# Patient Record
Sex: Male | Born: 1995 | Race: Black or African American | Hispanic: No | Marital: Single | Smoking: Never smoker
Health system: Southern US, Community
[De-identification: ages and names within clinical notes are randomized; demographics above are authoritative.]

## PROBLEM LIST (undated history)

## (undated) DIAGNOSIS — J45909 Unspecified asthma, uncomplicated: Secondary | ICD-10-CM

## (undated) HISTORY — PX: ANKLE SURGERY: SHX546

---
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
## 2019-03-01 ENCOUNTER — Encounter (HOSPITAL_COMMUNITY): Payer: Self-pay | Admitting: Emergency Medicine

## 2019-03-01 ENCOUNTER — Emergency Department (HOSPITAL_COMMUNITY)
Admission: EM | Admit: 2019-03-01 | Discharge: 2019-03-01 | Disposition: A | Discharge status: Home / Self Care | Payer: Self-pay | Attending: Emergency Medicine | Admitting: Emergency Medicine

## 2019-03-01 ENCOUNTER — Other Ambulatory Visit: Payer: Self-pay

## 2019-03-01 DIAGNOSIS — Z202 Contact with and (suspected) exposure to infections with a predominantly sexual mode of transmission: Secondary | ICD-10-CM | POA: Insufficient documentation

## 2019-03-01 DIAGNOSIS — J45909 Unspecified asthma, uncomplicated: Secondary | ICD-10-CM | POA: Insufficient documentation

## 2019-03-01 LAB — URINALYSIS, ROUTINE W REFLEX MICROSCOPIC
Bacteria, UA: NONE SEEN
Bilirubin Urine: NEGATIVE
Glucose, UA: NEGATIVE mg/dL
Hgb urine dipstick: NEGATIVE
Ketones, ur: 5 mg/dL — AB
Leukocytes,Ua: NEGATIVE
Nitrite: NEGATIVE
Protein, ur: NEGATIVE mg/dL
Specific Gravity, Urine: 1.024 (ref 1.005–1.030)
pH: 5 (ref 5.0–8.0)

## 2019-03-01 MED ORDER — CEFTRIAXONE SODIUM 250 MG IJ SOLR
250.0000 mg | Freq: Once | INTRAMUSCULAR | Status: AC
  Administered 2019-03-01: 23:00:00 250 mg via INTRAMUSCULAR
  Filled 2019-03-01: qty 250

## 2019-03-01 MED ORDER — AZITHROMYCIN 250 MG PO TABS
1000.0000 mg | ORAL_TABLET | Freq: Once | ORAL | Status: AC
  Administered 2019-03-01: 23:00:00 1000 mg via ORAL
  Filled 2019-03-01: qty 4

## 2019-03-01 MED ORDER — STERILE WATER FOR INJECTION IJ SOLN
INTRAMUSCULAR | Status: AC
  Administered 2019-03-01: 0.9 mL
  Filled 2019-03-01: qty 10

## 2019-03-01 NOTE — ED Triage Notes (Signed)
Patient wants to get tested for STD

## 2019-03-01 NOTE — ED Provider Notes (Signed)
[facility]-EMERGENCY DEPT Provider Note   CSN: [id number] Arrival date & time: 03/01/19  2146    History   Chief Complaint Chief Complaint  Patient presents with  . Exposure to STD    HPI Roger Conner is a 23 y.o. male.     HPI   Pt is a 23 y/o male with a h/o asthma who presents to the ED today for evaluation for exposure to an STD.  States he had unprotected intercourse a few days ago with a male and he is concern for sexually transmitted infection.  Denies any penile discharge, dysuria, frequency, or other symptoms.  States he try to by the health department but it was closed.  He is also requesting HIV/RPR testing.  Past Medical History:  Diagnosis Date  . Asthma     There are no active problems to display for this patient.   Past Surgical History:  Procedure Laterality Date  . ANKLE SURGERY          Home Medications    Prior to Admission medications   Medication Sig Start Date End Date Taking? Authorizing Provider  albuterol (PROVENTIL HFA;VENTOLIN HFA) 108 (90 BASE) MCG/ACT inhaler Inhale 2 puffs into the lungs every 4 (four) hours as needed for wheezing or shortness of breath. 10/17/14   Reuben LikesKeller, David C, MD    Family History History reviewed. No pertinent family history.  Social History Social History   Tobacco Use  . Smoking status: Never Smoker  . Smokeless tobacco: Never Used  Substance Use Topics  . Alcohol use: No  . Drug use: No     Allergies   Patient has no known allergies.   Review of Systems Review of Systems  Constitutional: Negative for fever.  Respiratory: Negative for shortness of breath.   Cardiovascular: Negative for chest pain.  Gastrointestinal: Negative for abdominal pain, constipation, diarrhea, nausea and vomiting.  Genitourinary: Negative for dysuria, frequency, hematuria, penile swelling, scrotal swelling, testicular pain and urgency.  Musculoskeletal: Negative for back pain.   Neurological: Negative for headaches.     Physical Exam Updated Vital Signs BP 135/86 (BP Location: Left Arm)   Pulse 65   Temp 98.8 F (37.1 C) (Oral)   Resp 14   Ht 5\' 7"  (1.702 m)   Wt 72.6 kg   SpO2 100%   BMI 25.06 kg/m   Physical Exam Constitutional:      General: He is not in acute distress.    Appearance: He is well-developed.  Eyes:     Conjunctiva/sclera: Conjunctivae normal.  Cardiovascular:     Rate and Rhythm: Normal rate and regular rhythm.  Pulmonary:     Effort: Pulmonary effort is normal.     Breath sounds: Normal breath sounds.  Abdominal:     Palpations: Abdomen is soft.     Tenderness: There is no abdominal tenderness.  Genitourinary:    Penis: Normal.      Scrotum/Testes: Normal.     Comments: Chaperone present Skin:    General: Skin is warm and dry.  Neurological:     Mental Status: He is alert and oriented to person, place, and time.      ED Treatments / Results  Labs (all labs ordered are listed, but only abnormal results are displayed) Labs Reviewed  URINALYSIS, ROUTINE W REFLEX MICROSCOPIC - Abnormal; Notable for the following components:      Result Value   Ketones, ur 5 (*)    All other components within normal limits  RAPID HIV SCREEN (HIV 1/2 AB+AG)  RPR  GC/CHLAMYDIA PROBE AMP ([place]) NOT AT [facility]    EKG None  Radiology No results found.  Procedures Procedures (including critical care time)  Medications Ordered in ED Medications  cefTRIAXone (ROCEPHIN) injection 250 mg (250 mg Intramuscular Given 03/01/19 2309)  azithromycin (ZITHROMAX) tablet 1,000 mg (1,000 mg Oral Given 03/01/19 2308)  sterile water (preservative free) injection (0.9 mLs  Given 03/01/19 2310)     Initial Impression / Assessment and Plan / ED Course  I have reviewed the triage vital signs and the nursing notes.  Pertinent labs & imaging results that were available during my care of the patient were reviewed by me and considered in my  medical decision making (see chart for details).    Final Clinical Impressions(s) / ED Diagnoses   Final diagnoses:  Possible exposure to STD   Patient is afebrile without abdominal tenderness, abdominal pain or painful bowel movements to indicate prostatitis.  No tenderness to palpation of the testes or epididymis to suggest orchitis or epididymitis.  STD cultures obtained including HIV, syphilis, gonorrhea and chlamydia. Patient to be discharged with instructions to follow up with PCP. Discussed importance of using protection when sexually active. Pt understands that they have GC/Chlamydia cultures pending and that they will need to inform all sexual partners if results return positive. UA negative. Patient has been treated prophylactically with azithromycin and Rocephin.   ED Discharge Orders    None       Bishop Dublin 03/01/19 2338    Shanon Rosser, MD 03/02/19 [phone]

## 2019-03-01 NOTE — Discharge Instructions (Signed)
You have been tested for HIV, syphilis, chlamydia and gonorrhea.  These results will be available in approximately 3 days and you will be contacted by the hospital if the results are positive. Avoid sexual contact until you are aware of the results, and please inform all sexual partners if you test positive for any of these diseases.  Please follow up with your primary care provider within 5-7 days for re-evaluation of your symptoms. Please return to the emergency department for any new or worsening symptoms.

## 2019-03-01 NOTE — ED Notes (Signed)
Bed: WA07 Expected date:  Expected time:  Means of arrival:  Comments: 

## 2019-03-02 LAB — RAPID HIV SCREEN (HIV 1/2 AB+AG)
HIV 1/2 Antibodies: NONREACTIVE
HIV-1 P24 Antigen - HIV24: NONREACTIVE

## 2019-03-02 LAB — GC/CHLAMYDIA PROBE AMP (~~LOC~~) NOT AT ARMC
Chlamydia: NEGATIVE
Neisseria Gonorrhea: NEGATIVE

## 2019-03-02 LAB — RPR: RPR Ser Ql: NONREACTIVE

## 2019-10-29 ENCOUNTER — Other Ambulatory Visit: Payer: Self-pay

## 2019-10-29 ENCOUNTER — Encounter (HOSPITAL_COMMUNITY): Payer: Self-pay

## 2019-10-29 ENCOUNTER — Ambulatory Visit (HOSPITAL_COMMUNITY)
Admission: EM | Admit: 2019-10-29 | Discharge: 2019-10-29 | Disposition: A | Discharge status: Home / Self Care | Payer: Medicaid Other | Attending: Family Medicine | Admitting: Family Medicine

## 2019-10-29 DIAGNOSIS — Z113 Encounter for screening for infections with a predominantly sexual mode of transmission: Secondary | ICD-10-CM | POA: Insufficient documentation

## 2019-10-29 LAB — HIV ANTIBODY (ROUTINE TESTING W REFLEX): HIV Screen 4th Generation wRfx: NONREACTIVE

## 2019-10-29 NOTE — Discharge Instructions (Addendum)
Your result will be available via MyChart in approximately 72 hours. If any lab results are abnormal, you will be notified via phone of any recommended treatment.

## 2019-10-29 NOTE — ED Triage Notes (Signed)
Pt is here to receive an STD screening, he denies any symptoms at this time.

## 2019-10-29 NOTE — ED Provider Notes (Signed)
[facility]    CSN: [id number] Arrival date & time: 10/29/19  1054      History   Chief Complaint Chief Complaint  Patient presents with  . Exposure to STD    HPI Franciso Dierks is a 24 y.o. male.   HPI Sexually Transmitted Disease Check: Patient presents for sexually transmitted disease check. Sexual history reviewed with the patient. STD exposure: denies knowledge of risky exposure.  Current symptoms include none.  Past Medical History:  Diagnosis Date  . Asthma     There are no problems to display for this patient.   Past Surgical History:  Procedure Laterality Date  . ANKLE SURGERY         Home Medications    Prior to Admission medications   Medication Sig Start Date End Date Taking? Authorizing Provider  albuterol (PROVENTIL HFA;VENTOLIN HFA) 108 (90 BASE) MCG/ACT inhaler Inhale 2 puffs into the lungs every 4 (four) hours as needed for wheezing or shortness of breath. 10/17/14   Harden Mo, MD    Family History History reviewed. No pertinent family history.  Social History Social History   Tobacco Use  . Smoking status: Never Smoker  . Smokeless tobacco: Never Used  Substance Use Topics  . Alcohol use: No  . Drug use: No     Allergies   Patient has no known allergies.   Review of Systems Review of Systems Pertinent negatives listed in HPI  Physical Exam Triage Vital Signs ED Triage Vitals  Enc Vitals Group     BP 10/29/19 1120 (!) 155/78     Pulse Rate 10/29/19 1120 62     Resp 10/29/19 1120 16     Temp 10/29/19 1120 98.1 F (36.7 C)     Temp Source 10/29/19 1120 Oral     SpO2 10/29/19 1120 98 %     Weight --      Height --      Head Circumference --      Peak Flow --      Pain Score 10/29/19 1122 0     Pain Loc --      Pain Edu? --      Excl. in [place]? --    No data found.  Updated Vital Signs BP (!) 155/78 (BP Location: Left Arm)   Pulse 62   Temp 98.1 F (36.7 C) (Oral)   Resp 16   SpO2 98%    Visual Acuity Right Eye Distance:   Left Eye Distance:   Bilateral Distance:    Right Eye Near:   Left Eye Near:    Bilateral Near:     Physical Exam General appearance: alert, well developed, well nourished, cooperative and in no distress Head: Normocephalic, without obvious abnormality, atraumatic Respiratory: Respirations even and unlabored, normal respiratory rate Heart: rate and rhythm normal. Extremities: No gross deformities Skin: Skin color, texture, turgor normal. No rashes seen  Psych: Appropriate mood and affect. Neurologic:Alert, oriented to person, place, and time, thought content appropriate.   UC Treatments / Results  Labs (all labs ordered are listed, but only abnormal results are displayed) Labs Reviewed - No data to display  EKG   Radiology No results found.  Procedures Procedures (including critical care time)  Medications Ordered in UC Medications - No data to display  Initial Impression / Assessment and Plan / UC Course  I have reviewed the triage vital signs and the nursing notes.  Pertinent labs & imaging results that were available during  my care of the patient were reviewed by me and considered in my medical decision making (see chart for details).     Screen for STD (sexually transmitted disease), no exposure, asymptomatic routine screening.  RPR, HIV, GC chlamydia pending.  Patient given education regarding safe sex practices.   Final Clinical Impressions(s) / UC Diagnoses   Final diagnoses:  Screen for STD (sexually transmitted disease)     Discharge Instructions     Your result will be available via MyChart in approximately 72 hours. If any lab results are abnormal, you will be notified via phone of any recommended treatment.    ED Prescriptions    None     PDMP not reviewed this encounter.   Bing Neighbors, FNP 10/29/19 1144

## 2019-10-30 LAB — RPR: RPR Ser Ql: NONREACTIVE

## 2019-10-31 LAB — CYTOLOGY, (ORAL, ANAL, URETHRAL) ANCILLARY ONLY
Chlamydia: NEGATIVE
Neisseria Gonorrhea: NEGATIVE
Trichomonas: NEGATIVE

## 2021-05-09 ENCOUNTER — Ambulatory Visit: Payer: Self-pay

## 2021-06-27 ENCOUNTER — Ambulatory Visit (HOSPITAL_COMMUNITY)
Admission: EM | Admit: 2021-06-27 | Discharge: 2021-06-27 | Disposition: A | Discharge status: Home / Self Care | Payer: Medicaid Other | Attending: Medical Oncology | Admitting: Medical Oncology

## 2021-06-27 ENCOUNTER — Encounter (HOSPITAL_COMMUNITY): Payer: Self-pay | Admitting: Emergency Medicine

## 2021-06-27 ENCOUNTER — Other Ambulatory Visit: Payer: Self-pay

## 2021-06-27 DIAGNOSIS — R519 Headache, unspecified: Secondary | ICD-10-CM

## 2021-06-27 DIAGNOSIS — H8110 Benign paroxysmal vertigo, unspecified ear: Secondary | ICD-10-CM

## 2021-06-27 MED ORDER — MECLIZINE HCL 25 MG PO TABS: 25.0000 mg | ORAL_TABLET | Freq: Three times a day (TID) | ORAL | 0 refills | Status: AC | PRN

## 2021-06-27 NOTE — ED Triage Notes (Signed)
Pt presents with dizziness and headache that started this am while at work.

## 2021-06-27 NOTE — ED Provider Notes (Signed)
[facility]    CSN: [id number] Arrival date & time: 06/27/21  1224      History   Chief Complaint Chief Complaint  Patient presents with   Headache   Dizziness    HPI Roger Conner is a 25 y.o. male.   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  He then started to feel like he was panicked with some shortness of breath and developed a headache from the stress.  This quickly resolved when he sat down and rested.  He felt much better.  He states that he then went back to work and was fine until he bend over again. Has not reoccurred since and reports that headache is greatly improved. He reports no history with exercise tolerance. No chest pains, flu like symptoms, cough. He reports that he was not overheated, dehydrated or fasting. Has has stress increase lately.   Past Medical History:  Diagnosis Date   Asthma     There are no problems to display for this patient.   Past Surgical History:  Procedure Laterality Date   ANKLE SURGERY         Home Medications    Prior to Admission medications   Medication Sig Start Date End Date Taking? Authorizing Provider  albuterol (PROVENTIL HFA;VENTOLIN HFA) 108 (90 BASE) MCG/ACT inhaler Inhale 2 puffs into the lungs every 4 (four) hours as needed for wheezing or shortness of breath. 10/17/14   Reuben Likes, MD    Family History History reviewed. No pertinent family history.  Social History Social History   Tobacco Use   Smoking status: Never   Smokeless tobacco: Never  Vaping Use   Vaping Use: Never used  Substance Use Topics   Alcohol use: No   Drug use: No     Allergies   Patient has no known allergies.   Review of Systems Review of Systems  As stated above in HPI Physical Exam Triage Vital Signs ED Triage Vitals [06/27/21 1447]  Enc Vitals Group      BP 138/84     Pulse Rate (!) 51     Resp 16     Temp 98.2 F (36.8 C)     Temp Source Oral     SpO2 98 %     Weight      Height      Head Circumference      Peak Flow      Pain Score 0     Pain Loc      Pain Edu?      Excl. in GC?    No data found.  Updated Vital Signs BP 138/84 (BP Location: Left Arm)   Pulse (!) 51   Temp 98.2 F (36.8 C) (Oral)   Resp 16   SpO2 98%   Physical Exam Vitals and nursing note reviewed.  Constitutional:      General: He is not in acute distress.    Appearance: He is well-developed. He is not ill-appearing, toxic-appearing or diaphoretic.  HENT:     Head: Normocephalic and atraumatic.     Mouth/Throat:     Mouth: Mucous membranes are moist.     Pharynx: Oropharynx is clear.  Eyes:     General: No scleral icterus.    Extraocular Movements: Extraocular movements intact.  Right eye: Nystagmus (slight) present. Normal extraocular motion.     Left eye: Normal extraocular motion and no nystagmus.     Pupils: Pupils are equal, round, and reactive to light. Pupils are equal.     Right eye: Pupil is round and reactive.     Left eye: Pupil is round and reactive.  Cardiovascular:     Rate and Rhythm: Normal rate and regular rhythm.     Heart sounds: Normal heart sounds.  Pulmonary:     Effort: Pulmonary effort is normal.     Breath sounds: Normal breath sounds.  Musculoskeletal:     Cervical back: Normal range of motion and neck supple. No rigidity.  Lymphadenopathy:     Cervical: No cervical adenopathy.  Skin:    General: Skin is warm.     Capillary Refill: Capillary refill takes less than 2 seconds.     Coloration: Skin is not pale.  Neurological:     Mental Status: He is alert and oriented to person, place, and time.     Cranial Nerves: No cranial nerve deficit or facial asymmetry.     Sensory: No sensory deficit.     Motor: No weakness.     Coordination: Coordination normal.     Gait: Gait normal.     Deep Tendon  Reflexes: Reflexes normal.     Comments: Dix Hallpike maneuver positive  Psychiatric:        Mood and Affect: Mood normal.        Speech: Speech normal.        Behavior: Behavior normal.     UC Treatments / Results  Labs (all labs ordered are listed, but only abnormal results are displayed) Labs Reviewed - No data to display  EKG   Radiology No results found.  Procedures Procedures (including critical care time)  Medications Ordered in UC Medications - No data to display  Initial Impression / Assessment and Plan / UC Course  I have reviewed the triage vital signs and the nursing notes.  Pertinent labs & imaging results that were available during my care of the patient were reviewed by me and considered in my medical decision making (see chart for details).     New.  This BPPV with patient including how this typically occurs, length of time that this typically lasts and ways to help prevent and treat.  Sending in meclizine.  He will rest and stay hydrated.  Should his symptoms occur with other movements he will consult his PCP and potentially cardiology.  He is slightly bradycardic when his pulse rate was initially checked but this improved to mid 60s on exam which is patient's normal.  I do also think that his recent increase in stress likely played a role especially with the BPPV pairing.  Final Clinical Impressions(s) / UC Diagnoses   Final diagnoses:  None   Discharge Instructions   None    ED Prescriptions   None    PDMP not reviewed this encounter.   Rushie Chestnut, [place] 06/27/21 1619

## 2021-12-19 ENCOUNTER — Ambulatory Visit
Admission: RE | Admit: 2021-12-19 | Discharge: 2021-12-19 | Disposition: A | Discharge status: Home / Self Care | Payer: BC Managed Care – PPO | Source: Ambulatory Visit | Attending: Physician Assistant | Admitting: Physician Assistant

## 2021-12-19 VITALS — BP 144/78 | HR 58 | Temp 98.0°F | Resp 18

## 2021-12-19 DIAGNOSIS — M545 Low back pain, unspecified: Secondary | ICD-10-CM

## 2021-12-19 MED ORDER — CYCLOBENZAPRINE HCL 10 MG PO TABS: 10.0000 mg | ORAL_TABLET | Freq: Two times a day (BID) | ORAL | 0 refills | Status: AC | PRN

## 2021-12-19 MED ORDER — PREDNISONE 20 MG PO TABS: 40.0000 mg | ORAL_TABLET | Freq: Every day | ORAL | 0 refills | Status: AC

## 2021-12-19 NOTE — ED Provider Notes (Addendum)
?[facility] ? ? ? ?CSN: [id number] ?Arrival date & time: 12/19/21  1346 ? ? ?  ? ?History   ?Chief Complaint ?Chief Complaint  ?Patient presents with  ? Back Pain  ?  Entered by patient  ? ? ?HPI ?Roger Conner is a 26 y.o. male.  ? ?Patient here today for evaluation of back pain that has been intermittent since 2019. He notes that currently his pain is present to both sides of his low back. He notes that trying to stand worsens pain. He has not had any numbness or tingling. He has not had any loss of bowel or bladder function. He did see chiropractor without significant improvement.  ? ?The history is provided by the patient.  ?Back Pain ?Associated symptoms: no fever, no numbness and no weakness   ? ?Past Medical History:  ?Diagnosis Date  ? Asthma   ? ? ?There are no problems to display for this patient. ? ? ?Past Surgical History:  ?Procedure Laterality Date  ? ANKLE SURGERY    ? ? ? ? ? ?Home Medications   ? ?Prior to Admission medications   ?Medication Sig Start Date End Date Taking? Authorizing Provider  ?cyclobenzaprine (FLEXERIL) 10 MG tablet Take 1 tablet (10 mg total) by mouth 2 (two) times daily as needed for muscle spasms. 12/19/21  Yes Tomi Bamberger, PA-C  ?predniSONE (DELTASONE) 20 MG tablet Take 2 tablets (40 mg total) by mouth daily with breakfast for 5 days. 12/19/21 12/24/21 Yes Tomi Bamberger, PA-C  ?albuterol (PROVENTIL HFA;VENTOLIN HFA) 108 (90 BASE) MCG/ACT inhaler Inhale 2 puffs into the lungs every 4 (four) hours as needed for wheezing or shortness of breath. 10/17/14   Reuben Likes, MD  ?meclizine (ANTIVERT) 25 MG tablet Take 1 tablet (25 mg total) by mouth 3 (three) times daily as needed for dizziness. 06/27/21   Rushie Chestnut, PA-C  ? ? ?Family History ?History reviewed. No pertinent family history. ? ?Social History ?Social History  ? ?Tobacco Use  ? Smoking status: Never  ? Smokeless tobacco: Never  ?Vaping Use  ? Vaping Use: Never used  ?Substance Use Topics  ?  Alcohol use: No  ? Drug use: No  ? ? ? ?Allergies   ?Patient has no known allergies. ? ? ?Review of Systems ?Review of Systems  ?Constitutional:  Negative for chills and fever.  ?Eyes:  Negative for discharge and redness.  ?Respiratory:  Negative for shortness of breath.   ?Musculoskeletal:  Positive for back pain and myalgias.  ?Neurological:  Negative for weakness and numbness.  ? ? ?Physical Exam ?Triage Vital Signs ?ED Triage Vitals  ?Enc Vitals Group  ?   BP 12/19/21 1425 (!) 144/78  ?   Pulse Rate 12/19/21 1425 (!) 58  ?   Resp 12/19/21 1425 18  ?   Temp 12/19/21 1425 98 ?F (36.7 ?C)  ?   Temp Source 12/19/21 1425 Oral  ?   SpO2 12/19/21 1425 98 %  ?   Weight --   ?   Height --   ?   Head Circumference --   ?   Peak Flow --   ?   Pain Score 12/19/21 1426 0  ?   Pain Loc --   ?   Pain Edu? --   ?   Excl. in GC? --   ? ?No data found. ? ?Updated Vital Signs ?BP (!) 144/78 (BP Location: Left Arm)   Pulse (!) 58   Temp  98 ?F (36.7 ?C) (Oral)   Resp 18   SpO2 98%  ? ?Physical Exam ?Vitals and nursing note reviewed.  ?Constitutional:   ?   General: He is not in acute distress. ?   Appearance: Normal appearance. He is not ill-appearing.  ?HENT:  ?   Head: Normocephalic and atraumatic.  ?Eyes:  ?   Conjunctiva/sclera: Conjunctivae normal.  ?Cardiovascular:  ?   Rate and Rhythm: Normal rate.  ?Pulmonary:  ?   Effort: Pulmonary effort is normal.  ?Musculoskeletal:  ?   Comments: No TTP to midline spine or diffusely to lower back at time of exam  ?Neurological:  ?   Mental Status: He is alert.  ?Psychiatric:     ?   Mood and Affect: Mood normal.     ?   Behavior: Behavior normal.     ?   Thought Content: Thought content normal.  ? ? ? ?UC Treatments / Results  ?Labs ?(all labs ordered are listed, but only abnormal results are displayed) ?Labs Reviewed - No data to display ? ?EKG ? ? ?Radiology ?No results found. ? ?Procedures ?Procedures (including critical care time) ? ?Medications Ordered in UC ?Medications - No  data to display ? ?Initial Impression / Assessment and Plan / UC Course  ?I have reviewed the triage vital signs and the nursing notes. ? ?Pertinent labs & imaging results that were available during my care of the patient were reviewed by me and considered in my medical decision making (see chart for details). ? ?  ?Suspect muscular strain- will treat with muscle relaxer and steroid burst. Encouraged follow up with ortho with any persistent symptoms or further concerns.  ? ?Final Clinical Impressions(s) / UC Diagnoses  ? ?Final diagnoses:  ?Acute bilateral low back pain without sciatica  ? ?Discharge Instructions   ?None ?  ? ?ED Prescriptions   ? ? Medication Sig Dispense Auth. Provider  ? predniSONE (DELTASONE) 20 MG tablet Take 2 tablets (40 mg total) by mouth daily with breakfast for 5 days. 10 tablet Tomi Bamberger, PA-C  ? cyclobenzaprine (FLEXERIL) 10 MG tablet Take 1 tablet (10 mg total) by mouth 2 (two) times daily as needed for muscle spasms. 20 tablet Tomi Bamberger, PA-C  ? ?  ? ?PDMP not reviewed this encounter. ?  ?Tomi Bamberger, PA-C ?12/19/21 1623 ? ?  ?Tomi Bamberger, PA-C ?12/19/21 1623 ? ?

## 2021-12-19 NOTE — ED Triage Notes (Signed)
Pt c/o lower back pain onset ~ 2019. States major back spasms that hurt to yawn. Reports seeing chiropractor in 2020 without resolution to concern.  ?

## 2022-04-26 ENCOUNTER — Ambulatory Visit
Admission: RE | Admit: 2022-04-26 | Discharge: 2022-04-26 | Disposition: A | Discharge status: Home / Self Care | Payer: Self-pay | Source: Ambulatory Visit | Attending: Physician Assistant | Admitting: Physician Assistant

## 2022-04-26 VITALS — BP 146/85 | HR 71 | Temp 98.1°F | Resp 18

## 2022-04-26 DIAGNOSIS — Z113 Encounter for screening for infections with a predominantly sexual mode of transmission: Secondary | ICD-10-CM | POA: Insufficient documentation

## 2022-04-26 NOTE — ED Triage Notes (Signed)
Pt presents for STD Testing after a partner tested positive for an STI ; pt states he is not having any symptoms.

## 2022-04-26 NOTE — ED Provider Notes (Signed)
[facility]    CSN: [id number] Arrival date & time: 04/26/22  1346      History   Chief Complaint Chief Complaint  Patient presents with   SEXUALLY TRANSMITTED DISEASE    STD testing - Entered by patient    HPI Roger Conner is a 26 y.o. male.   Patient here today for STD screening.  He reports that he has partner that tested positive for STD, he thinks this was trichomonas.  He is not having any symptoms and denies any discharge, dysuria, abdominal pain or genital rash.  The history is provided by the patient.    Past Medical History:  Diagnosis Date   Asthma     There are no problems to display for this patient.   Past Surgical History:  Procedure Laterality Date   ANKLE SURGERY         Home Medications    Prior to Admission medications   Medication Sig Start Date End Date Taking? Authorizing Provider  albuterol (PROVENTIL HFA;VENTOLIN HFA) 108 (90 BASE) MCG/ACT inhaler Inhale 2 puffs into the lungs every 4 (four) hours as needed for wheezing or shortness of breath. 10/17/14   Reuben Likes, MD  cyclobenzaprine (FLEXERIL) 10 MG tablet Take 1 tablet (10 mg total) by mouth 2 (two) times daily as needed for muscle spasms. 12/19/21   Tomi Bamberger, PA-C  meclizine (ANTIVERT) 25 MG tablet Take 1 tablet (25 mg total) by mouth 3 (three) times daily as needed for dizziness. 06/27/21   Rushie Chestnut, PA-C    Family History Family History  Family history unknown: Yes    Social History Social History   Tobacco Use   Smoking status: Never   Smokeless tobacco: Never  Vaping Use   Vaping Use: Never used  Substance Use Topics   Alcohol use: No   Drug use: No     Allergies   Patient has no known allergies.   Review of Systems Review of Systems  Constitutional:  Negative for chills and fever.  Eyes:  Negative for discharge and redness.  Respiratory:  Negative for shortness of breath.   Gastrointestinal:  Negative for abdominal  pain.  Genitourinary:  Negative for dysuria, genital sores and penile discharge.     Physical Exam Triage Vital Signs ED Triage Vitals [04/26/22 1435]  Enc Vitals Group     BP (!) 146/85     Pulse Rate 71     Resp 18     Temp 98.1 F (36.7 C)     Temp Source Oral     SpO2 95 %     Weight      Height      Head Circumference      Peak Flow      Pain Score 0     Pain Loc      Pain Edu?      Excl. in GC?    No data found.  Updated Vital Signs BP (!) 146/85 (BP Location: Left Arm)   Pulse 71   Temp 98.1 F (36.7 C) (Oral)   Resp 18   SpO2 95%      Physical Exam Vitals and nursing note reviewed.  Constitutional:      General: He is not in acute distress.    Appearance: Normal appearance. He is not ill-appearing.  HENT:     Head: Normocephalic and atraumatic.  Eyes:     Conjunctiva/sclera: Conjunctivae normal.  Cardiovascular:     Rate  and Rhythm: Normal rate.  Pulmonary:     Effort: Pulmonary effort is normal.  Neurological:     Mental Status: He is alert.  Psychiatric:        Mood and Affect: Mood normal.        Behavior: Behavior normal.        Thought Content: Thought content normal.      UC Treatments / Results  Labs (all labs ordered are listed, but only abnormal results are displayed) Labs Reviewed  HIV ANTIBODY (ROUTINE TESTING W REFLEX)  HEPATITIS PANEL, ACUTE  RPR  CYTOLOGY, (ORAL, ANAL, URETHRAL) ANCILLARY ONLY    EKG   Radiology No results found.  Procedures Procedures (including critical care time)  Medications Ordered in UC Medications - No data to display  Initial Impression / Assessment and Plan / UC Course  I have reviewed the triage vital signs and the nursing notes.  Pertinent labs & imaging results that were available during my care of the patient were reviewed by me and considered in my medical decision making (see chart for details).  STD screening ordered.  Will await results further recommendation.  Advised  abstinence while awaiting results.   Final Clinical Impressions(s) / UC Diagnoses   Final diagnoses:  Screening for STD (sexually transmitted disease)   Discharge Instructions   None    ED Prescriptions   None    PDMP not reviewed this encounter.   Tomi Bamberger, PA-C 04/26/22 1454

## 2022-04-27 LAB — CYTOLOGY, (ORAL, ANAL, URETHRAL) ANCILLARY ONLY
Chlamydia: NEGATIVE
Comment: NEGATIVE
Comment: NEGATIVE
Comment: NORMAL
Neisseria Gonorrhea: NEGATIVE
Trichomonas: NEGATIVE

## 2022-04-28 LAB — RPR: RPR Ser Ql: NONREACTIVE

## 2022-04-28 LAB — HIV ANTIBODY (ROUTINE TESTING W REFLEX): HIV Screen 4th Generation wRfx: NONREACTIVE

## 2022-09-05 ENCOUNTER — Encounter (HOSPITAL_COMMUNITY): Payer: Self-pay

## 2022-09-05 ENCOUNTER — Ambulatory Visit (HOSPITAL_COMMUNITY)
Admission: EM | Admit: 2022-09-05 | Discharge: 2022-09-05 | Disposition: A | Discharge status: Home / Self Care | Payer: Self-pay | Attending: Emergency Medicine | Admitting: Emergency Medicine

## 2022-09-05 ENCOUNTER — Ambulatory Visit (INDEPENDENT_AMBULATORY_CARE_PROVIDER_SITE_OTHER): Payer: Self-pay

## 2022-09-05 DIAGNOSIS — S92425A Nondisplaced fracture of distal phalanx of left great toe, initial encounter for closed fracture: Secondary | ICD-10-CM

## 2022-09-05 NOTE — ED Provider Notes (Signed)
[facility]    CSN: [id number] Arrival date & time: 09/05/22  1158      History   Chief Complaint Chief Complaint  Patient presents with   Toe Pain    HPI Roger Conner is a 26 y.o. male.  Patient presents complaining of left greater toe pain that started last week.  Patient reports the onset of symptoms began after playing a basketball game.  Patient states that he does not remember any trauma or fall that could have been indicative of his symptoms.  Patient reports the next morning after basketball game he noticed swelling to his left greater toe and pain with ambulation.  Patient reports reduced pain when not standing on his foot.  Patient denies any constant numbness or tingling in his left lower extremity.  Patient denies any changes to the skin on his foot.  Patient denies any previous injury or orthopedic surgery on his left foot.  He states that he took Tylenol once this week with no relief of symptoms.    Toe Pain    Past Medical History:  Diagnosis Date   Asthma     There are no problems to display for this patient.   Past Surgical History:  Procedure Laterality Date   ANKLE SURGERY         Home Medications    Prior to Admission medications   Medication Sig Start Date End Date Taking? Authorizing Provider  albuterol (PROVENTIL HFA;VENTOLIN HFA) 108 (90 BASE) MCG/ACT inhaler Inhale 2 puffs into the lungs every 4 (four) hours as needed for wheezing or shortness of breath. 10/17/14   Harden Mo, MD  cyclobenzaprine (FLEXERIL) 10 MG tablet Take 1 tablet (10 mg total) by mouth 2 (two) times daily as needed for muscle spasms. 12/19/21   Francene Finders, PA-C  meclizine (ANTIVERT) 25 MG tablet Take 1 tablet (25 mg total) by mouth 3 (three) times daily as needed for dizziness. 06/27/21   Hughie Closs, PA-C    Family History Family History  Family history unknown: Yes    Social History Social History   Tobacco Use   Smoking status:  Never   Smokeless tobacco: Never  Vaping Use   Vaping Use: Never used  Substance Use Topics   Alcohol use: No   Drug use: No     Allergies   Patient has no known allergies.   Review of Systems Review of Systems HPI   Physical Exam Triage Vital Signs ED Triage Vitals  Enc Vitals Group     BP 09/05/22 1415 128/71     Pulse Rate 09/05/22 1414 69     Resp 09/05/22 1414 16     Temp 09/05/22 1414 97.9 F (36.6 C)     Temp Source 09/05/22 1414 Oral     SpO2 09/05/22 1414 98 %     Weight --      Height --      Head Circumference --      Peak Flow --      Pain Score 09/05/22 1415 8     Pain Loc --      Pain Edu? --      Excl. in [place]? --    No data found.  Updated Vital Signs BP 128/71 (BP Location: Left Arm)   Pulse 69   Temp 97.9 F (36.6 C) (Oral)   Resp 16   SpO2 98%     Physical Exam Vitals and nursing note reviewed.  Constitutional:  Appearance: Normal appearance.  Cardiovascular:     Pulses:          Dorsalis pedis pulses are 2+ on the left side.       Posterior tibial pulses are 2+ on the left side.  Musculoskeletal:     Right foot: Normal.     Left foot: Decreased range of motion (With flexion of left greater toe). Normal capillary refill. Swelling, tenderness and bony tenderness present. No laceration. Normal pulse.       Legs:     Comments: Reports pain on plantar side of LFT foot along medial side under left greater toe. Swelling present to LFT greater toe on medial side along distal phalanx region of greater toe.  Normal cap refill in left foot.   Skin:    Capillary Refill: Capillary refill takes less than 2 seconds.  Neurological:     Mental Status: He is alert.      UC Treatments / Results  Labs (all labs ordered are listed, but only abnormal results are displayed) Labs Reviewed - No data to display  EKG   Radiology DG Foot 2 Views Left  Result Date: 09/05/2022 CLINICAL DATA:  Left great toe pain and swelling after injury 1  week ago EXAM: LEFT FOOT - 2 VIEW COMPARISON:  None Available. FINDINGS: Tiny nondisplaced intra-articular fracture along the lateral base of the great toe distal phalanx. Suspect remote posttraumatic deformity of the left fifth toe proximal phalanx at the PIP joint level. Asymmetric spurring along the lateral margin of the first MTP joint, which may also reflect sequela of remote trauma. Osseous structures are otherwise intact. No malalignment. Soft tissues within normal limits. IMPRESSION: 1. Tiny nondisplaced intra-articular fracture along the lateral base of the great toe distal phalanx. 2. Suspected remote posttraumatic deformities of the left fifth toe proximal phalanx and first MTP joint. Electronically Signed   By: Duanne Guess D.O.   On: 09/05/2022 15:53    Procedures Procedures (including critical care time)  Medications Ordered in UC Medications - No data to display  Initial Impression / Assessment and Plan / UC Course  I have reviewed the triage vital signs and the nursing notes.  Pertinent labs & imaging results that were available during my care of the patient were reviewed by me and considered in my medical decision making (see chart for details).     Patient was evaluated for distal phalanx fracture of left greater toe.  Left foot x-ray showed distal phalanx fracture of greater toe and a suspected fracture of the left fifth toe.  Patient denies any pain in his left fifth toe, possible old injury.  Postop shoe was given in office.  Patient was made aware of treatment regiment.  Patient was made aware that he will need to follow-up with [facility].  Patient was given a work note.  Patient verbalized understanding of instructions.  Charting was provided using a a verbal dictation system, charting was proofread for errors, errors may occur which could change the meaning of the information charted.   Final Clinical Impressions(s) / UC Diagnoses   Final diagnoses:  Closed  nondisplaced fracture of distal phalanx of left great toe, initial encounter     Discharge Instructions      We applied a postop shoe in office for stabilization.  Please follow-up with [facility].   You can take ibuprofen every 6 hours (400-600 mg) , do not take more than 2400 mg in a 24-hour day.  I advised that you  do not take ibuprofen on an empty stomach, ibuprofen can cause GI problems such as GI bleeding.       ED Prescriptions   None    PDMP not reviewed this encounter.   Flossie Dibble, NP 09/05/22 [phone]

## 2022-09-05 NOTE — Discharge Instructions (Signed)
We applied a postop shoe in office for stabilization.  Please follow-up with [facility].   You can take ibuprofen every 6 hours (400-600 mg) , do not take more than 2400 mg in a 24-hour day.  I advised that you do not take ibuprofen on an empty stomach, ibuprofen can cause GI problems such as GI bleeding.

## 2022-09-05 NOTE — ED Triage Notes (Signed)
Pt states left great toe pain and swelling states it started hurting after he played basketball last week. States he took tylenol once this week.

## 2023-02-05 ENCOUNTER — Ambulatory Visit
Admission: RE | Admit: 2023-02-05 | Discharge: 2023-02-05 | Disposition: A | Discharge status: Home / Self Care | Payer: Self-pay | Source: Ambulatory Visit | Attending: Internal Medicine | Admitting: Internal Medicine

## 2023-02-05 VITALS — BP 137/85 | HR 82 | Temp 98.3°F | Resp 18

## 2023-02-05 DIAGNOSIS — Z113 Encounter for screening for infections with a predominantly sexual mode of transmission: Secondary | ICD-10-CM | POA: Insufficient documentation

## 2023-02-05 NOTE — ED Provider Notes (Signed)
[facility]    CSN: [id number] Arrival date & time: 02/05/23  1508      History   Chief Complaint Chief Complaint  Patient presents with   SEXUALLY TRANSMITTED DISEASE    STD Testing - Entered by patient    HPI Roger Conner is a 27 y.o. male.   Patient presents today for routine STD testing.  He denies any exposure or any associated symptoms.  Reports that he has had unprotected intercourse with 2 new sexual partners.     Past Medical History:  Diagnosis Date   Asthma     There are no problems to display for this patient.   Past Surgical History:  Procedure Laterality Date   ANKLE SURGERY         Home Medications    Prior to Admission medications   Medication Sig Start Date End Date Taking? Authorizing Provider  albuterol (PROVENTIL HFA;VENTOLIN HFA) 108 (90 BASE) MCG/ACT inhaler Inhale 2 puffs into the lungs every 4 (four) hours as needed for wheezing or shortness of breath. 10/17/14   Reuben Likes, MD  cyclobenzaprine (FLEXERIL) 10 MG tablet Take 1 tablet (10 mg total) by mouth 2 (two) times daily as needed for muscle spasms. 12/19/21   Tomi Bamberger, PA-C  meclizine (ANTIVERT) 25 MG tablet Take 1 tablet (25 mg total) by mouth 3 (three) times daily as needed for dizziness. 06/27/21   Rushie Chestnut, PA-C    Family History Family History  Family history unknown: Yes    Social History Social History   Tobacco Use   Smoking status: Never   Smokeless tobacco: Never  Vaping Use   Vaping Use: Never used  Substance Use Topics   Alcohol use: No   Drug use: No     Allergies   Patient has no known allergies.   Review of Systems Review of Systems Per HPI  Physical Exam Triage Vital Signs ED Triage Vitals  Enc Vitals Group     BP 02/05/23 1523 137/85     Pulse Rate 02/05/23 1523 82     Resp 02/05/23 1523 18     Temp 02/05/23 1523 98.3 F (36.8 C)     Temp Source 02/05/23 1523 Oral     SpO2 02/05/23 1523 99 %      Weight --      Height --      Head Circumference --      Peak Flow --      Pain Score 02/05/23 1522 0     Pain Loc --      Pain Edu? --      Excl. in GC? --    No data found.  Updated Vital Signs BP 137/85 (BP Location: Left Arm)   Pulse 82   Temp 98.3 F (36.8 C) (Oral)   Resp 18   SpO2 99%   Visual Acuity Right Eye Distance:   Left Eye Distance:   Bilateral Distance:    Right Eye Near:   Left Eye Near:    Bilateral Near:     Physical Exam Constitutional:      General: He is not in acute distress.    Appearance: Normal appearance. He is not toxic-appearing or diaphoretic.  HENT:     Head: Normocephalic and atraumatic.  Eyes:     Extraocular Movements: Extraocular movements intact.     Conjunctiva/sclera: Conjunctivae normal.  Pulmonary:     Effort: Pulmonary effort is normal.  Genitourinary:  Comments: Deferred with shared decision making.  Self swab performed. Neurological:     General: No focal deficit present.     Mental Status: He is alert and oriented to person, place, and time. Mental status is at baseline.  Psychiatric:        Mood and Affect: Mood normal.        Behavior: Behavior normal.        Thought Content: Thought content normal.        Judgment: Judgment normal.      UC Treatments / Results  Labs (all labs ordered are listed, but only abnormal results are displayed) Labs Reviewed  RPR  HIV ANTIBODY (ROUTINE TESTING W REFLEX)  CYTOLOGY, (ORAL, ANAL, URETHRAL) ANCILLARY ONLY    EKG   Radiology No results found.  Procedures Procedures (including critical care time)  Medications Ordered in UC Medications - No data to display  Initial Impression / Assessment and Plan / UC Course  I have reviewed the triage vital signs and the nursing notes.  Pertinent labs & imaging results that were available during my care of the patient were reviewed by me and considered in my medical decision making (see chart for details).     Patient  here for routine STD testing so cytology swab, HIV, RPR test pending.  Will await results for any treatment, if necessary.  Advised patient to refrain from sexual activity until test results and treatment are complete.  Patient verbalized understanding and was agreeable with plan. Final Clinical Impressions(s) / UC Diagnoses   Final diagnoses:  Screening examination for venereal disease     Discharge Instructions      Your STD testing is pending.  Will call if anything is positive and treat as appropriate if necessary.  Please refrain from sexual activity until test results and treatment are complete.  Follow-up with any further concerns.    ED Prescriptions   None    PDMP not reviewed this encounter.   Gustavus Bryant, [place] 02/05/23 1536

## 2023-02-05 NOTE — Discharge Instructions (Signed)
Your STD testing is pending.  Will call if anything is positive and treat as appropriate if necessary.  Please refrain from sexual activity until test results and treatment are complete.  Follow-up with any further concerns.

## 2023-02-05 NOTE — ED Triage Notes (Signed)
Pt presents for STD testing. Denies sxs. Pt denies STD exposure.

## 2023-02-06 LAB — RPR: RPR Ser Ql: NONREACTIVE

## 2023-02-06 LAB — HIV ANTIBODY (ROUTINE TESTING W REFLEX): HIV Screen 4th Generation wRfx: NONREACTIVE

## 2023-02-09 LAB — CYTOLOGY, (ORAL, ANAL, URETHRAL) ANCILLARY ONLY
Chlamydia: NEGATIVE
Comment: NEGATIVE
Comment: NEGATIVE
Comment: NORMAL
Neisseria Gonorrhea: NEGATIVE
Trichomonas: NEGATIVE

## 2024-02-28 ENCOUNTER — Ambulatory Visit
Admission: EM | Admit: 2024-02-28 | Discharge: 2024-02-28 | Disposition: A | Discharge status: Home / Self Care | Payer: Self-pay | Attending: Physician Assistant | Admitting: Physician Assistant

## 2024-02-28 ENCOUNTER — Encounter: Payer: Self-pay | Admitting: Emergency Medicine

## 2024-02-28 DIAGNOSIS — K0889 Other specified disorders of teeth and supporting structures: Secondary | ICD-10-CM

## 2024-02-28 DIAGNOSIS — K047 Periapical abscess without sinus: Secondary | ICD-10-CM

## 2024-02-28 MED ORDER — AMOXICILLIN-POT CLAVULANATE 875-125 MG PO TABS: 1.0000 | ORAL_TABLET | Freq: Two times a day (BID) | ORAL | 0 refills | Status: AC

## 2024-02-28 MED ORDER — IBUPROFEN 800 MG PO TABS: 800.0000 mg | ORAL_TABLET | Freq: Three times a day (TID) | ORAL | 0 refills | Status: AC

## 2024-02-28 NOTE — ED Provider Notes (Signed)
 [facility]    CSN: [id number] Arrival date & time: 02/28/24  0946      History   Chief Complaint Chief Complaint  Patient presents with   Dental Pain    HPI Roger Conner is a 28 y.o. male.   Patient presents today with a several month history of intermittent swelling and discomfort around his right lower molar.  He reports that he has not seen a dentist recently as this had been improving on its own but then 2 days ago it became incredibly painful and swollen.  He reports that pain is rated 10 on a 0-10 pain scale, described as aching, no aggravating or alleviating factors identified.  He has not been taking any over-the-counter medication for symptom management but has applied a cool compress because he was having difficulty sleeping last night that provided no relief.  Denies any recent dental procedure or extraction.  He denies any swelling of his throat, shortness of breath, muffled voice, fever, nausea, vomiting.  He is having difficulty chewing as a result of the pain.  Denies any recent antibiotics in the past 90 days.    Past Medical History:  Diagnosis Date   Asthma     There are no active problems to display for this patient.   Past Surgical History:  Procedure Laterality Date   ANKLE SURGERY         Home Medications    Prior to Admission medications   Medication Sig Start Date End Date Taking? Authorizing Provider  amoxicillin-clavulanate (AUGMENTIN) 875-125 MG tablet Take 1 tablet by mouth every 12 (twelve) hours. 02/28/24  Yes Marnie Fazzino K, PA-C  ibuprofen (ADVIL) 800 MG tablet Take 1 tablet (800 mg total) by mouth 3 (three) times daily. 02/28/24  Yes Milianna Ericsson, Betsey Brow, PA-C  albuterol  (PROVENTIL  HFA;VENTOLIN  HFA) 108 (90 BASE) MCG/ACT inhaler Inhale 2 puffs into the lungs every 4 (four) hours as needed for wheezing or shortness of breath. 10/17/14   Shanda Dark, MD  cyclobenzaprine  (FLEXERIL ) 10 MG tablet Take 1 tablet (10 mg total) by  mouth 2 (two) times daily as needed for muscle spasms. 12/19/21   Vernestine Gondola, PA-C  meclizine  (ANTIVERT ) 25 MG tablet Take 1 tablet (25 mg total) by mouth 3 (three) times daily as needed for dizziness. 06/27/21   Sharla Davis, PA-C    Family History Family History  Family history unknown: Yes    Social History Social History   Tobacco Use   Smoking status: Never    Passive exposure: Never   Smokeless tobacco: Never  Vaping Use   Vaping status: Never Used  Substance Use Topics   Alcohol use: No   Drug use: No     Allergies   Patient has no known allergies.   Review of Systems Review of Systems  Constitutional:  Positive for activity change. Negative for appetite change, fatigue and fever.  HENT:  Positive for dental problem and sore throat. Negative for congestion, facial swelling, sinus pressure, sneezing, trouble swallowing and voice change.   Respiratory:  Negative for shortness of breath.   Cardiovascular:  Negative for chest pain.  Gastrointestinal:  Negative for nausea and vomiting.  Neurological:  Negative for headaches.     Physical Exam Triage Vital Signs ED Triage Vitals  Encounter Vitals Group     BP 02/28/24 1042 137/89     Systolic BP Percentile --      Diastolic BP Percentile --      Pulse  Rate 02/28/24 1042 (!) 53     Resp 02/28/24 1042 16     Temp 02/28/24 1042 98.2 F (36.8 C)     Temp Source 02/28/24 1042 Oral     SpO2 02/28/24 1042 98 %     Weight 02/28/24 1041 160 lb 0.9 oz (72.6 kg)     Height --      Head Circumference --      Peak Flow --      Pain Score 02/28/24 1041 10     Pain Loc --      Pain Education --      Exclude from Growth Chart --    No data found.  Updated Vital Signs BP 137/89 (BP Location: Left Arm)   Pulse (!) 53   Temp 98.2 F (36.8 C) (Oral)   Resp 16   Wt 160 lb 0.9 oz (72.6 kg)   SpO2 98%   BMI 25.07 kg/m   Visual Acuity Right Eye Distance:   Left Eye Distance:   Bilateral Distance:     Right Eye Near:   Left Eye Near:    Bilateral Near:     Physical Exam Vitals reviewed.  Constitutional:      General: He is awake.     Appearance: Normal appearance. He is well-developed. He is not ill-appearing.     Comments: Very pleasant male appears stated age in no acute distress sitting comfortably in exam room  HENT:     Head: Normocephalic and atraumatic.     Right Ear: Tympanic membrane, ear canal and external ear normal. Tympanic membrane is not erythematous or bulging.     Left Ear: Tympanic membrane, ear canal and external ear normal. Tympanic membrane is not erythematous or bulging.     Nose: Nose normal.     Mouth/Throat:     Dentition: Abnormal dentition. Dental tenderness and gingival swelling present.     Pharynx: Uvula midline. No oropharyngeal exudate, posterior oropharyngeal erythema, uvula swelling or postnasal drip.      Comments: No evidence of Ludwig angina Cardiovascular:     Rate and Rhythm: Normal rate and regular rhythm.     Heart sounds: Normal heart sounds, S1 normal and S2 normal. No murmur heard. Pulmonary:     Effort: Pulmonary effort is normal. No accessory muscle usage or respiratory distress.     Breath sounds: Normal breath sounds. No stridor. No wheezing, rhonchi or rales.     Comments: Clear to auscultation bilaterally Lymphadenopathy:     Head:     Right side of head: No submental, submandibular or tonsillar adenopathy.     Left side of head: No submental, submandibular or tonsillar adenopathy.     Cervical: No cervical adenopathy.  Neurological:     Mental Status: He is alert.  Psychiatric:        Behavior: Behavior is cooperative.      UC Treatments / Results  Labs (all labs ordered are listed, but only abnormal results are displayed) Labs Reviewed - No data to display  EKG   Radiology No results found.  Procedures Procedures (including critical care time)  Medications Ordered in UC Medications - No data to  display  Initial Impression / Assessment and Plan / UC Course  I have reviewed the triage vital signs and the nursing notes.  Pertinent labs & imaging results that were available during my care of the patient were reviewed by me and considered in my medical decision making (see chart for  details).     Patient is well-appearing, afebrile, nontoxic, nontachycardic.  No indication for emergent evaluation or imaging.  Patient was treated for dental infection with Augmentin twice daily for 10 days.  He was given ibuprofen 800 mg for pain relief with instruction to take this with food to prevent GI upset.  He is to avoid use of additional NSAIDs.  Can use Tylenol  for breakthrough pain.  Recommend he gargle with warm salt water  for additional symptom relief.  Discussed that ultimately he will need to see dentist to address underlying tooth.  He was provided low-cost dental resources in the area with after visit summary.  If he develops any worsening symptoms including fever, nausea, vomiting, swelling of his throat, muffled voice, dysphagia he needs to go to the emergency room to which he expressed understanding.  Work excuse note declined.   Final Clinical Impressions(s) / UC Diagnoses   Final diagnoses:  Dental infection  Dentalgia     Discharge Instructions      Start Augmentin twice daily for 10 days.  Use ibuprofen 800 mg up to 3 times a day for pain relief.  You should avoid NSAIDs including aspirin, ibuprofen/Advil, naproxen/Aleve with this medication as it causes stomach bleeding.  You can use Tylenol  for breakthrough pain.  Gargle with warm salt water  for additional symptom relief.  You should follow-up with dentist; call to schedule an appointment.  If you develop any worsening symptoms including difficulty swallowing, difficulty speaking, swelling of your throat, high fever, change in your voice you need to be seen immediately.    ED Prescriptions     Medication Sig Dispense Auth.  Provider   amoxicillin-clavulanate (AUGMENTIN) 875-125 MG tablet Take 1 tablet by mouth every 12 (twelve) hours. 20 tablet Iram Lundberg K, PA-C   ibuprofen (ADVIL) 800 MG tablet Take 1 tablet (800 mg total) by mouth 3 (three) times daily. 21 tablet Eileene Kisling K, PA-C      PDMP not reviewed this encounter.   Budd Cargo, PA-C 02/28/24 1219

## 2024-02-28 NOTE — ED Triage Notes (Signed)
 Pt presents c/o dental pain on right lower side of mouth x 2 days. Last dental visit a while ago. Pt says he cannot eat or sleep with this dental pain.

## 2024-02-28 NOTE — Discharge Instructions (Signed)
Start Augmentin twice daily for 10 days.  Use ibuprofen 800 mg up to 3 times a day for pain relief.  You should avoid NSAIDs including aspirin, ibuprofen/Advil, naproxen/Aleve with this medication as it causes stomach bleeding.  You can use Tylenol for breakthrough pain.  Gargle with warm salt water for additional symptom relief.  You should follow-up with dentist; call to schedule an appointment.  If you develop any worsening symptoms including difficulty swallowing, difficulty speaking, swelling of your throat, high fever, change in your voice you need to be seen immediately.
